# Patient Record
Sex: Female | Born: 1970 | Race: Black or African American | Hispanic: No | Marital: Single | Smoking: Never smoker
Health system: Southern US, Community
[De-identification: ages and names within clinical notes are randomized; demographics above are authoritative.]

---

## 2013-07-15 ENCOUNTER — Ambulatory Visit: Payer: Self-pay | Admitting: Specialist

## 2013-07-15 LAB — PHOSPHORUS: Phosphorus: 2.6 mg/dL (ref 2.5–4.9)

## 2013-07-15 LAB — CBC WITH DIFFERENTIAL/PLATELET
Eosinophil #: 0 10*3/uL (ref 0.0–0.7)
Eosinophil %: 0.8 %
HCT: 37.2 % (ref 35.0–47.0)
HGB: 12.5 g/dL (ref 12.0–16.0)
Lymphocyte #: 1.7 10*3/uL (ref 1.0–3.6)
Lymphocyte %: 32.6 %
MCHC: 33.7 g/dL (ref 32.0–36.0)
Monocyte #: 0.4 x10 3/mm (ref 0.2–0.9)
Monocyte %: 7.4 %
Neutrophil #: 3 10*3/uL (ref 1.4–6.5)
Neutrophil %: 58.7 %
RDW: 12.8 % (ref 11.5–14.5)
WBC: 5.1 10*3/uL (ref 3.6–11.0)

## 2013-07-15 LAB — COMPREHENSIVE METABOLIC PANEL
Alkaline Phosphatase: 59 U/L (ref 50–136)
Anion Gap: 4 — ABNORMAL LOW (ref 7–16)
Bilirubin,Total: 0.6 mg/dL (ref 0.2–1.0)
Calcium, Total: 8.4 mg/dL — ABNORMAL LOW (ref 8.5–10.1)
Co2: 26 mmol/L (ref 21–32)
Creatinine: 0.95 mg/dL (ref 0.60–1.30)
EGFR (African American): >60
EGFR (Non-African Amer.): >60
Osmolality: 274 (ref 275–301)
Potassium: 3.8 mmol/L (ref 3.5–5.1)
SGOT(AST): 18 U/L (ref 15–37)
Sodium: 137 mmol/L (ref 136–145)
Total Protein: 7.7 g/dL (ref 6.4–8.2)

## 2013-07-15 LAB — AMYLASE: Amylase: 59 U/L (ref 25–115)

## 2013-07-15 LAB — LIPASE, BLOOD: Lipase: 141 U/L (ref 73–393)

## 2013-07-15 LAB — MAGNESIUM: Magnesium: 2.1 mg/dL

## 2013-07-15 LAB — IRON AND TIBC
Iron Saturation: 29 %
Iron: 85 ug/dL (ref 50–170)

## 2013-07-15 LAB — FERRITIN: Ferritin (ARMC): 167 ng/mL (ref 8–388)

## 2013-07-15 LAB — BILIRUBIN, DIRECT: Bilirubin, Direct: 0.2 mg/dL (ref 0.00–0.20)

## 2013-07-15 LAB — PROTIME-INR: Prothrombin Time: 13.8 secs (ref 11.5–14.7)

## 2013-07-15 LAB — APTT: Activated PTT: 32.8 secs (ref 23.6–35.9)

## 2013-07-15 LAB — HEMOGLOBIN A1C: Hemoglobin A1C: 4.9 % (ref 4.2–6.3)

## 2013-07-15 LAB — TSH: Thyroid Stimulating Horm: 1.92 u[IU]/mL

## 2013-08-12 ENCOUNTER — Ambulatory Visit: Payer: Self-pay | Admitting: Specialist

## 2013-09-10 ENCOUNTER — Ambulatory Visit: Payer: Self-pay | Admitting: Specialist

## 2014-06-13 IMAGING — CR DG CHEST 2V
1 series · 2 of 2 positions shown · non-contrast
Comparison: none

REASON FOR EXAM: Snoring, Morbid Obesity.
COMMENTS:

PROCEDURE:     DXR - DXR CHEST PA (OR AP) AND LATERAL  - July 15, 2013  [DATE]
RESULT:     The lungs are clear. The heart and pulmonary vessels are normal.
The bony and mediastinal structures are unremarkable. There is no effusion.
There is no pneumothorax or evidence of congestive failure.

[Series 2: w chest pa · 0.14mm/px · 2 of 2 slices shown]
[im 1/2]
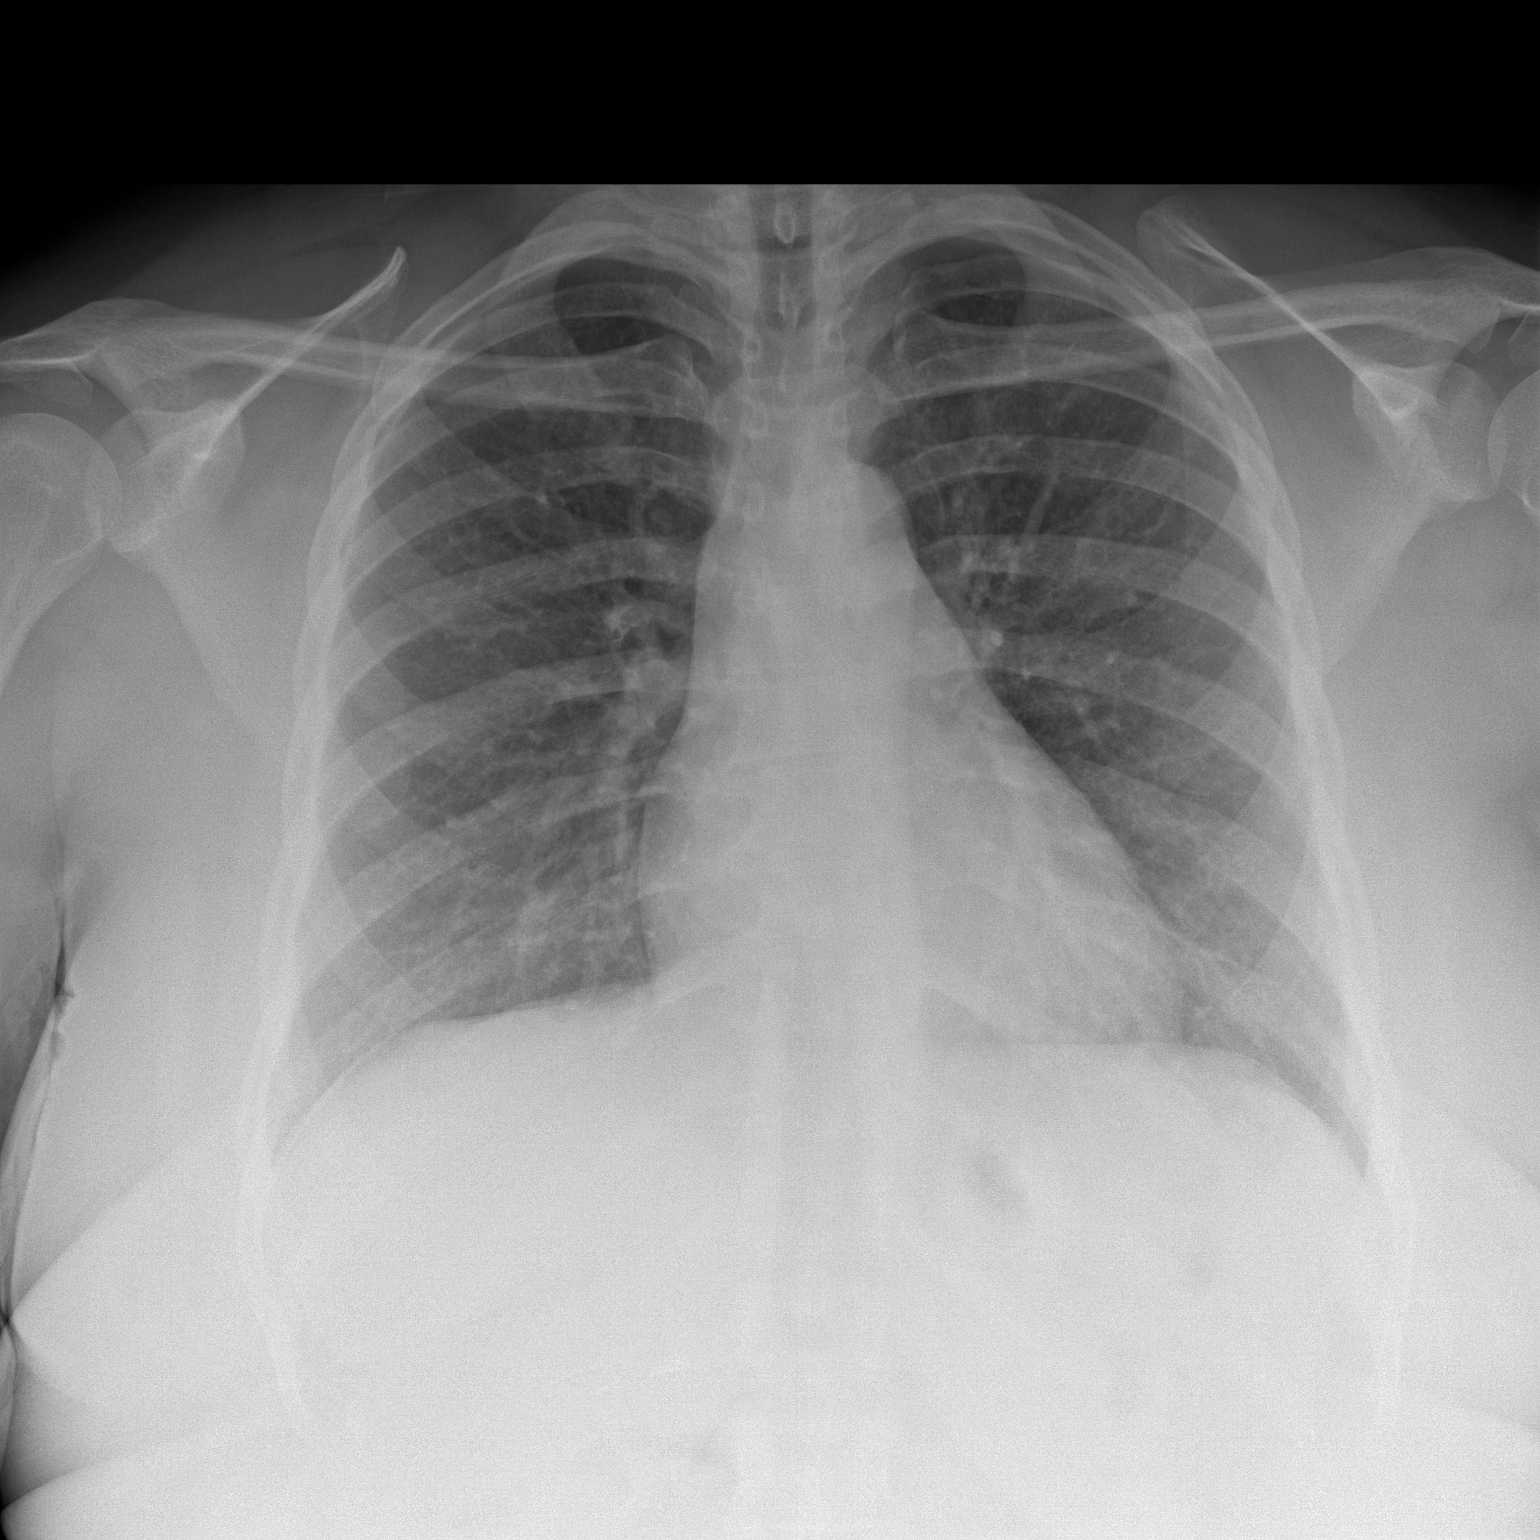
[im 2/2]
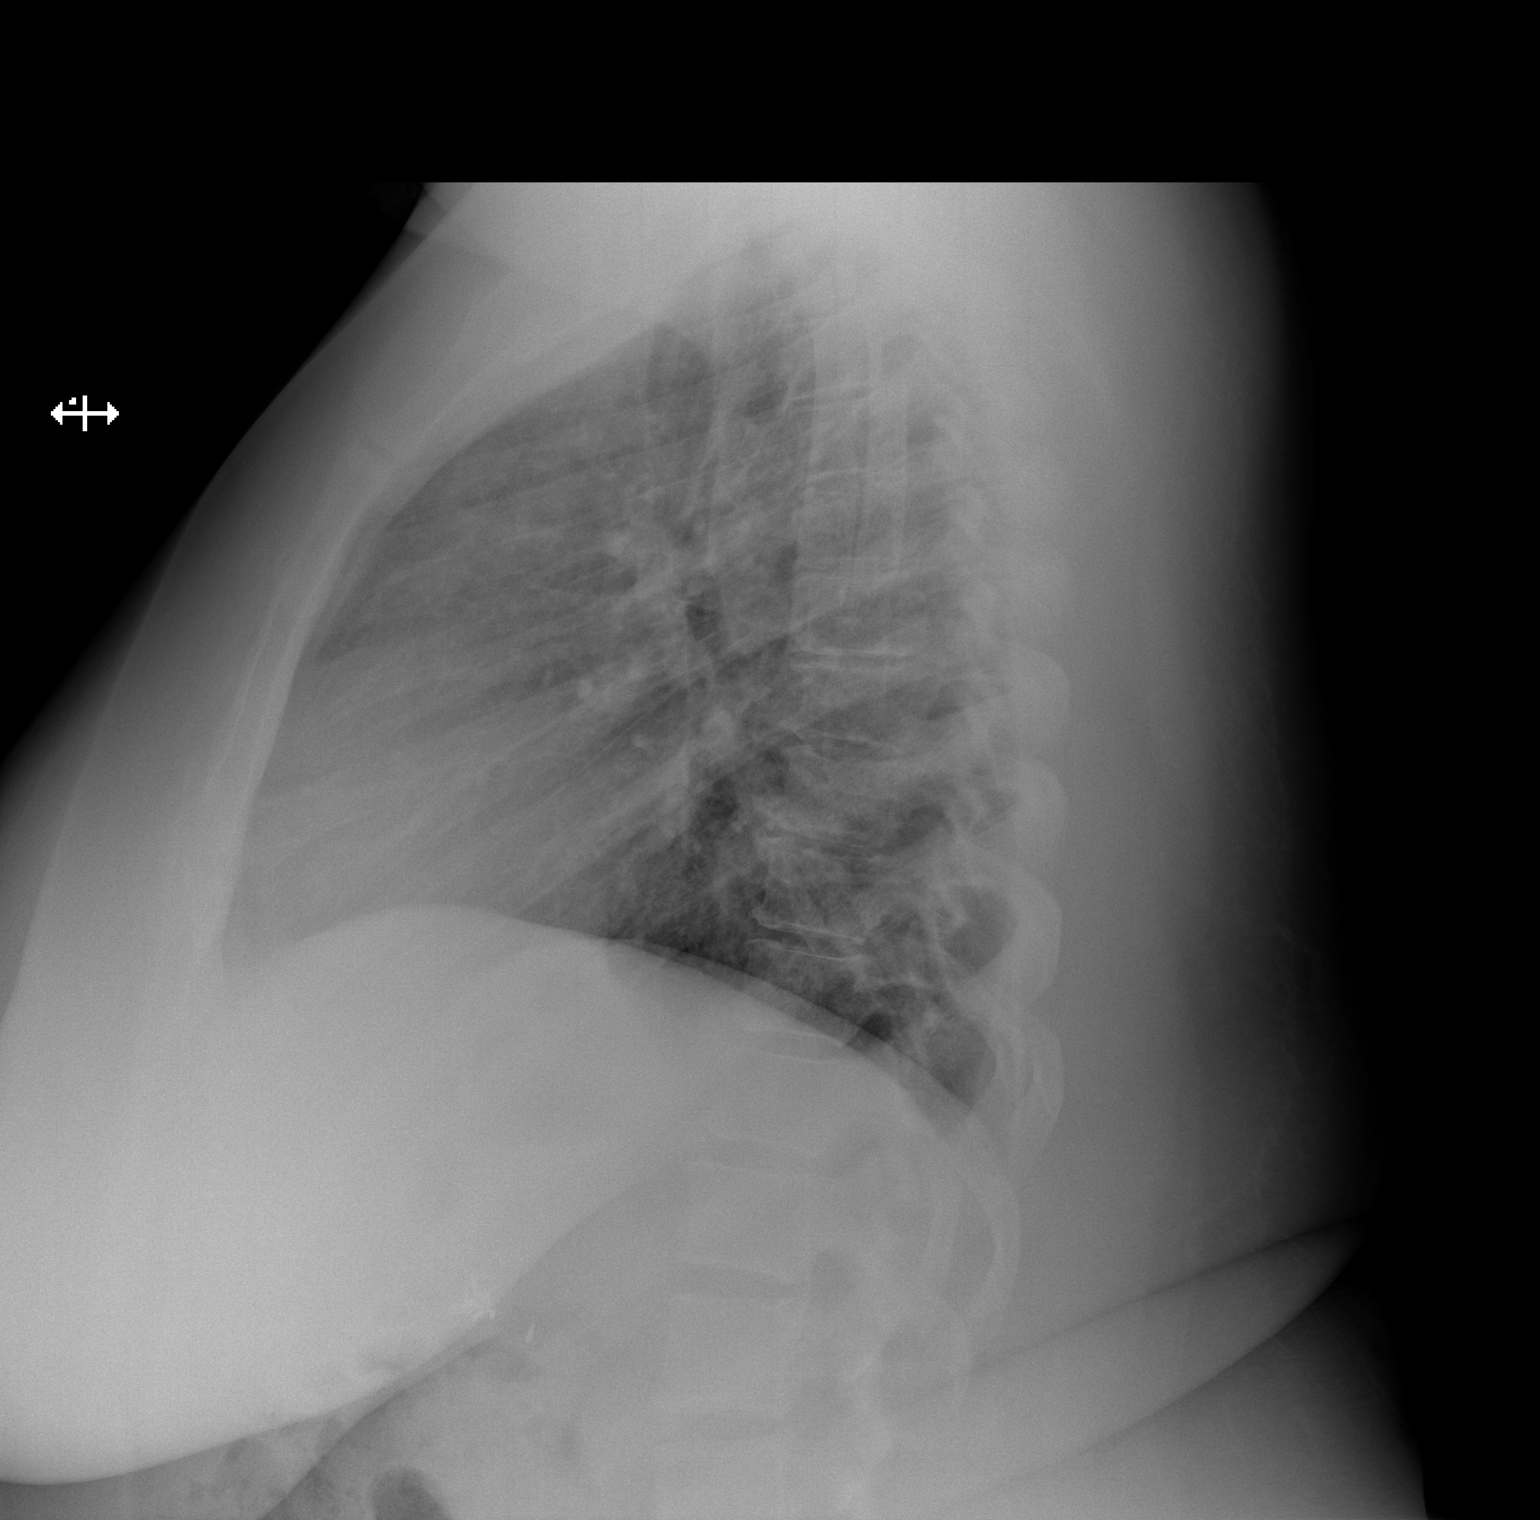

[2 of 2 positions shown; findings below may reference images not displayed]

IMPRESSION: No acute cardiopulmonary disease.

[REDACTED]

## 2015-03-02 ENCOUNTER — Ambulatory Visit
Admit: 2015-03-02 | Disposition: A | Discharge status: Home / Self Care | Payer: Self-pay | Attending: Specialist | Admitting: Specialist

## 2016-01-04 DIAGNOSIS — R002 Palpitations: Secondary | ICD-10-CM | POA: Insufficient documentation

## 2022-07-21 NOTE — Progress Notes (Signed)
Sleep Medicine   Office Visit  Patient Name: Mallory Thornton DOB: Oct 17, 1971 MRN [id number]    Chief Complaint: Sleep eval  Brief History:  Mallory Thornton presents for an initial consult for sleep evaluation and to establish care. Patient being evaluated for bariatric surgery. Patient reports her sleep quality is poor due to waking at night and not being able to go back to sleep. This is noted most nights. The patient's bed partner reports  loud snoring at night. The patient relates the following symptoms: sometimes waking tired, snores that wake her, some brain fog and lack of focus are also present. The patient goes to sleep at 10:30pm and wakes up at 6:00am. Patient reports if she is stressed she will wake repeatedly at night. She does also have abnormal sleep pattern from work and works during the day during the week, but at night on weekends. Sleep quality is worse when outside home environment. She has been given trazodone to help with sleep by her PCP, but reports she doesn't like taking medications if avoidable and has not been taking it. Patient has noted some restlessness of her legs at night that would disrupt her sleep, but she has gabapentin if needed.  The patient  relates no unusual behavior during the night.  The patient relates anxiety as a history of psychiatric problems. The Epworth Sleepiness Score is 6 out of 24.  The patient relates  Cardiovascular risk factors include: RBBB found during stress test and is followed by cardiology. The patient reports she had a sleep study in 2015 was negative for sleep apnea, but symptoms have worsened since then. She considering bariatric surgery at that time.   ROS  General: (-) fever, (-) chills, (-) night sweat Nose and Sinuses: (-) nasal stuffiness or itchiness, (-) postnasal drip, (-) nosebleeds, (-) sinus trouble. Mouth and Throat: (-) sore throat, (-) hoarseness. Neck: (-) swollen glands, (-) enlarged thyroid, (-) neck pain. Respiratory: -  cough, - shortness of breath, - wheezing. Neurologic: + numbness, + tingling. Psychiatric: + anxiety, - depression Sleep behavior: -sleep paralysis -hypnogogic hallucinations -dream enactment      -vivid dreams -cataplexy -night terrors -sleep walking   Current Medication: Outpatient Encounter Medications as of 07/23/2022  Medication Sig   gabapentin (NEURONTIN) 300 MG capsule Take by mouth.   No facility-administered encounter medications on file as of 07/23/2022.    Surgical History: History reviewed. No pertinent surgical history.  Medical History: History reviewed. No pertinent past medical history.  Family History: Non contributory to the present illness  Social History: Social History   Socioeconomic History   Marital status: Single    Spouse name: Not on file   Number of children: Not on file   Years of education: Not on file   Highest education level: Not on file  Occupational History   Not on file  Tobacco Use   Smoking status: Never   Smokeless tobacco: Never  Substance and Sexual Activity   Alcohol use: Yes   Drug use: Not on file   Sexual activity: Not on file  Other Topics Concern   Not on file  Social History Narrative   Not on file   Social Determinants of Health   Financial Resource Strain: Not on file  Food Insecurity: Not on file  Transportation Needs: Not on file  Physical Activity: Not on file  Stress: Not on file  Social Connections: Not on file  Intimate Partner Violence: Not on file    Vital Signs: Blood pressure  137/80, pulse 62, resp. rate 12, height 5' 4.5" (1.638 m), weight (!) 301 lb (136.5 kg), SpO2 98 %. Body mass index is 50.87 kg/m.   Examination: General Appearance: The patient is well-developed, well-nourished, and in no distress. Neck Circumference: 42cm Skin: Gross inspection of skin unremarkable. Head: normocephalic, no gross deformities. Eyes: no gross deformities noted. ENT: ears appear grossly  normal Neurologic: Alert and oriented. No involuntary movements.    STOP BANG RISK ASSESSMENT S (snore) Have you been told that you snore?     YES   T (tired) Are you often tired, fatigued, or sleepy during the day?   NO  O (obstruction) Do you stop breathing, choke, or gasp during sleep? NO   P (pressure) Do you have or are you being treated for high blood pressure? NO   B (BMI) Is your body index greater than 35 kg/m? YES   A (age) Are you 27 years old or older? NO   N (neck) Do you have a neck circumference greater than 16 inches?   YES   G (gender) Are you a female? NO   TOTAL STOP/BANG "YES" ANSWERS 3                                                               A STOP-Bang score of 2 or less is considered low risk, and a score of 5 or more is high risk for having either moderate or severe OSA. For people who score 3 or 4, doctors may need to perform further assessment to determine how likely they are to have OSA.         EPWORTH SLEEPINESS SCALE:  Scale:  (0)= no chance of dozing; (1)= slight chance of dozing; (2)= moderate chance of dozing; (3)= high chance of dozing  Chance  Situtation    Sitting and reading: 1    Watching TV: 1    Sitting Inactive in public: 0    As a passenger in car: 3      Lying down to rest: 1    Sitting and talking: 0    Sitting quielty after lunch: 0    In a car, stopped in traffic: 0   TOTAL SCORE:   6    SLEEP STUDIES:  Patient reports having a PSG at a [place] lab in 2015- Negative for OSA   LABS: No results found for this or any previous visit (from the past 2160 hour(s)).  Radiology: DG Chest 2 View  Result Date: 03/02/2015 CLINICAL DATA:  Preoperative evaluation for bariatric surgery EXAM: CHEST  2 VIEW COMPARISON:  None. FINDINGS: The heart size and mediastinal contours are within normal limits. Both lungs are clear. The visualized skeletal structures are unremarkable. IMPRESSION: No active cardiopulmonary  disease. Electronically Signed   By: Alcide Clever M.D.   On: 03/02/2015 09:20    No results found.  No results found.    Assessment and Plan: Patient Active Problem List   Diagnosis Date Noted   Heart palpitations 01/04/2016   Morbid (severe) obesity due to excess calories (HCC) 01/25/2015     PLAN OSA:   Patient evaluation suggests high risk of sleep disordered breathing due to waking tired, snoring that wake her, some brain fog and lack of focus Patient  has comorbid cardiovascular risk factors including: RBBB which could be exacerbated by pathologic sleep-disordered breathing.  Suggest: PSG to assess/treat the patient's sleep disordered breathing. The patient was also counselled on wt loss to optimize sleep health.   1. Hypersomnia Will order PSG  2. RLS (restless legs syndrome) Continue current medication and f/u with PCP.  3. Anxiety Not currently on medication and doing ok, followed by PCP  4. RBBB (right bundle branch block) Followed by cardiology  5. Bariatric surgery status Will order PSG as part of bariatric surgery evaluation  6. Morbid obesity with BMI of 50.0-59.9, adult (HCC) Obesity Counseling: Had a lengthy discussion regarding patients BMI and weight issues. Patient was instructed on portion control as well as increased activity. Also discussed caloric restrictions with trying to maintain intake less than 2000 Kcal. Discussions were made in accordance with the 5As of weight management. Simple actions such as not eating late and if able to, taking a walk is suggested.      General Counseling: I have discussed the findings of the evaluation and examination with Mallory Thornton.  I have also discussed any further diagnostic evaluation thatmay be needed or ordered today. Mallory Thornton verbalizes understanding of the findings of todays visit. We also reviewed her medications today and discussed drug interactions and side effects including but not limited excessive  drowsiness and altered mental states. We also discussed that there is always a risk not just to her but also people around her. she has been encouraged to call the office with any questions or concerns that should arise related to todays visit.  No orders of the defined types were placed in this encounter.       I have personally obtained a history, evaluated the patient, evaluated pertinent data, formulated the assessment and plan and placed orders.  This patient was seen by Lynn Ito, PA-C in collaboration with Dr. Freda Munro as a part of collaborative care agreement.    Yevonne Pax, MD [facility] Diplomate ABMS Pulmonary and Critical Care Medicine Sleep medicine

## 2022-07-23 ENCOUNTER — Ambulatory Visit (INDEPENDENT_AMBULATORY_CARE_PROVIDER_SITE_OTHER): Payer: BC Managed Care – PPO | Admitting: Internal Medicine

## 2022-07-23 VITALS — BP 137/80 | HR 62 | Resp 12 | Ht 64.5 in | Wt 301.0 lb

## 2022-07-23 DIAGNOSIS — Z9884 Bariatric surgery status: Secondary | ICD-10-CM

## 2022-07-23 DIAGNOSIS — I4510 Unspecified right bundle-branch block: Secondary | ICD-10-CM | POA: Diagnosis not present

## 2022-07-23 DIAGNOSIS — F419 Anxiety disorder, unspecified: Secondary | ICD-10-CM | POA: Diagnosis not present

## 2022-07-23 DIAGNOSIS — G4710 Hypersomnia, unspecified: Secondary | ICD-10-CM | POA: Diagnosis not present

## 2022-07-23 DIAGNOSIS — G2581 Restless legs syndrome: Secondary | ICD-10-CM | POA: Diagnosis not present

## 2022-07-23 DIAGNOSIS — Z6841 Body Mass Index (BMI) 40.0 and over, adult: Secondary | ICD-10-CM
